# Patient Record
Sex: Female | Born: 1975 | Race: Black or African American | Hispanic: No | Marital: Single | Smoking: Never smoker
Health system: Southern US, Community
[De-identification: ages and names within clinical notes are randomized; demographics above are authoritative.]

## PROBLEM LIST (undated history)

## (undated) DIAGNOSIS — G40909 Epilepsy, unspecified, not intractable, without status epilepticus: Secondary | ICD-10-CM

## (undated) HISTORY — PX: SALPINGOOPHORECTOMY: SHX82

## (undated) HISTORY — PX: CARPAL TUNNEL RELEASE: SHX101

## (undated) HISTORY — DX: Epilepsy, unspecified, not intractable, without status epilepticus: G40.909

---

## 2000-10-30 ENCOUNTER — Encounter: Payer: Self-pay | Admitting: Emergency Medicine

## 2000-10-30 ENCOUNTER — Emergency Department (HOSPITAL_COMMUNITY): Admission: EM | Admit: 2000-10-30 | Discharge: 2000-10-30 | Payer: Self-pay | Admitting: Emergency Medicine

## 2001-01-19 ENCOUNTER — Other Ambulatory Visit: Admission: RE | Admit: 2001-01-19 | Discharge: 2001-01-19 | Payer: Self-pay | Admitting: *Deleted

## 2002-02-08 ENCOUNTER — Other Ambulatory Visit: Admission: RE | Admit: 2002-02-08 | Discharge: 2002-02-08 | Payer: Self-pay | Admitting: *Deleted

## 2002-06-21 ENCOUNTER — Emergency Department (HOSPITAL_COMMUNITY): Admission: EM | Admit: 2002-06-21 | Discharge: 2002-06-21 | Payer: Self-pay | Admitting: Emergency Medicine

## 2003-06-05 ENCOUNTER — Other Ambulatory Visit: Admission: RE | Admit: 2003-06-05 | Discharge: 2003-06-05 | Payer: Self-pay | Admitting: Obstetrics and Gynecology

## 2004-06-11 ENCOUNTER — Other Ambulatory Visit: Admission: RE | Admit: 2004-06-11 | Discharge: 2004-06-11 | Payer: Self-pay | Admitting: Obstetrics and Gynecology

## 2005-06-24 ENCOUNTER — Other Ambulatory Visit: Admission: RE | Admit: 2005-06-24 | Discharge: 2005-06-24 | Payer: Self-pay | Admitting: Obstetrics and Gynecology

## 2007-08-01 ENCOUNTER — Ambulatory Visit (HOSPITAL_COMMUNITY): Admission: RE | Admit: 2007-08-01 | Discharge: 2007-08-01 | Payer: Self-pay | Admitting: Obstetrics and Gynecology

## 2007-08-01 ENCOUNTER — Encounter (INDEPENDENT_AMBULATORY_CARE_PROVIDER_SITE_OTHER): Payer: Self-pay | Admitting: Obstetrics and Gynecology

## 2010-12-29 NOTE — H&P (Signed)
Sheila Lowe, Sheila Lowe                ACCOUNT NO.:  [id number]   MEDICAL RECORD NO.:  [id number]          PATIENT TYPE:  AMB   LOCATION:  SDC                           FACILITY:  [facility]   PHYSICIAN:  Huel Cote, M.D. DATE OF BIRTH:  19-Jun-1976   DATE OF ADMISSION:  08/01/2007  DATE OF DISCHARGE:                              HISTORY & PHYSICAL   Surgery to take place on August 01, 2007, at 10:30 a.m.   The patient is a 35 year old nulligravida female who comes in for  resection of a probable dermoid cyst noted on ultrasound and recurrent  pelvic pain which may or may not be related to the cyst.  The patient  had an ultrasound performed  which identified approximately 5 cm right  ovarian cyst which appears on ultrasonography to be most consistent with  a dermoid.  She has also been having some intermittent pelvic pain.  It  is unclear if this is from intermittent torsion or is of a different  etiology.  Regardless, it was discussed with the patient that, given the  size of the dermoid, she is at high risk of torsion and should certainly  have this removed.  She at times has had significant pain which hurts  substantially and then resolves after approximately 5-7 minutes.  Otherwise has had no significant symptoms.   PAST MEDICAL HISTORY:  Is insignificant.   PAST SURGICAL HISTORY:  None.   PAST OBSTETRIC HISTORY:  None.   PAST GYN HISTORY:  No abnormal PAP smears.   ALLERGIES:  PENICILLIN.   MEDICATIONS:  Include Mircette only.   As stated, on ultrasound she has a left ovary which is normal and a  right ovarian 5 cm hyperechoic cyst which is suggestive of a dermoid.   PHYSICAL EXAMINATION:  VITAL SIGNS:  Blood pressure 145/92, weight 267  pounds.  CARDIAC:  Regular rate and rhythm.  LUNGS:  Clear.  ABDOMEN:  Soft and nontender.  PELVIC:  Normal external genitalia.  The cervix has no lesions.  The  uterus is normal in size. The right ovary is difficult to feel secondary  to the patient's body habitus, but there is a suggestion of some  enlargement, although a palpable mass is not obvious.   It was discussed with the patient the risks and benefits of proceeding  with surgery including bleeding, infection, and possible need to convert  to laparotomy should surgery not be able to be performed through the  camera.  She understands the risks of damage to bowel and bladder and  the risk of bleeding and need to convert to abdominal incision in the  event of any of these complications.  She understands the need for  surgery and agrees to proceed.      Huel Cote, M.D.  Electronically Signed     KR/MEDQ  D:  07/31/2007  T:  07/31/2007  Job:  [id number]

## 2010-12-29 NOTE — Op Note (Signed)
Sheila Lowe, Sheila Lowe                ACCOUNT NO.:  [id number]   MEDICAL RECORD NO.:  [id number]          PATIENT TYPE:  AMB   LOCATION:  SDC                           [facility]  [facility]   PHYSICIAN:  Huel Cote, M.D. DATE OF BIRTH:  Jun 07, 1976   DATE OF PROCEDURE:  08/01/2007  DATE OF DISCHARGE:                               OPERATIVE REPORT   PREOPERATIVE DIAGNOSIS:  Right ovarian mass and pain.   POSTOPERATIVE DIAGNOSIS:  Probable right dermoid cyst.   PROCEDURE:  Laparoscopic right salpingo-oophorectomy and resection of  dermoid with ovary.   SURGEON:  Dr. Huel Cote   ASSISTANT:  Zenaida Niece, M.D.   FINDINGS:  The ovary was approximately 5-6 cm in size with a probable  dermoid tumor noted within it.  Final pathology is pending.  The  remaining pelvic anatomy and abdominal anatomy appeared normal.  Final  pathology was pending as stated.   ESTIMATED BLOOD LOSS:  Minimal.   URINE OUTPUT:  Approximately 300 mL clear urine.   IV FLUIDS:  2200 mL LR.   There were no obvious complications.   PROCEDURE:  The patient was taken to operating room where general  anesthesia was obtained without difficulty.  She was then prepped and  draped in normal sterile fashion in dorsal lithotomy position.  A  speculum was placed within the vagina and a Hulka uterine manipulator  introduced into the uterus and a Foley catheter placed within the  bladder.  Attention was then turned to the patient's abdomen where a  small infraumbilical incision was made after injection with 0.25%  Marcaine.  The Veress needle was then introduced into the abdomen.  Intraperitoneal placement was attempted to be confirmed by both  aspiration and injection with normal saline.  It did take several tries  to get the Veress needle into a position which appeared to be in the  abdomen.  However, once gas flow was applied.  There was normal pressure  noted.  This was then removed and the OptiVu 10/11  trocar placed within  the umbilicus.  This was then visualized with the trocar reaching the  preperitoneal space and with direct visualization entering the  peritoneal cavity.  Once peritoneal cavity was entered, the uterus and  ovaries and tubes were inspected.  The left ovary appeared normal with  no obvious area of dermoid or other mass upon it.  The right ovary as  stated was elongated approximately 5-6 cm in size and was freely mobile.  There was a very small serosal fibroid noted on the upper left fundus.  Attention was then turned to the right ovary which was elevated with  atraumatic grasper.  The harmonic scalpel was then utilized to remove  the ovary and tube.  This was taken through the infundibulopelvic and  the utero-ovarian ligament without difficulty and the ovary and tube  were freed from their pedicles.  There was no active bleeding noted.  Therefore the camera was changed and two additional 5 mm trocars had  been placed in the lower lateral quadrants under direct visualization.  5 mm camera  was introduced through this and the harmonic scalpel  removed.  The endobag was placed into the 10/11 trocar in the midline  and the atraumatic grasper utilized to place the ovary and tube in the  endobag under visualization with a 5 mm scope in the lateral port.  This  was cinched down and endobag taken up to the umbilical incision.  The  endobag was then brought up to the level of the incision and the fascial  incision was extended slightly with Mayo scissors with the ovary pressed  up against the abdominal wall in the bag.  A Kocher clamp was utilized  to grasp the dermoid and large amount of fatty tissue was noted to exude  out of the ovary into the bag and collapsed the ovary which was then  removable through the umbilicus.  This was handed off to pathology.  The  fascia in the umbilicus was then closed with 0 Vicryl with the 5 mm  scope, watching for no bowel underneath the  incision.  The two 5-mm  ports were then utilized to inspect the ovarian pedicles one additional  time.  There was no active bleeding noted and all appeared stable.  Therefore the 5 mm trocars were removed under direct visualization and  the pneumoperitoneum reduced.  The skin was closed at all port sites  with 3-0 Vicryl in a subcuticular stitch.  Sponge, lap and needle counts  were correct x2 and the Hulka tenaculum and the Foley catheter were  removed from the patient's vagina and bladder and she was taken to the  recovery room awake and in stable condition      Huel Cote, M.D.  Electronically Signed     KR/MEDQ  D:  08/01/2007  T:  08/01/2007  Job:  [id number]

## 2011-05-21 LAB — CBC
HCT: 39.3
Hemoglobin: 13.2
MCHC: 33.6
MCV: 76.4 — ABNORMAL LOW
Platelets: 235
RBC: 5.14 — ABNORMAL HIGH
RDW: 15.6 — ABNORMAL HIGH
WBC: 6.1

## 2011-05-21 LAB — PREGNANCY, URINE: Preg Test, Ur: NEGATIVE

## 2014-04-25 ENCOUNTER — Other Ambulatory Visit: Payer: Self-pay | Admitting: Obstetrics and Gynecology

## 2014-04-25 DIAGNOSIS — R928 Other abnormal and inconclusive findings on diagnostic imaging of breast: Secondary | ICD-10-CM

## 2014-05-03 ENCOUNTER — Ambulatory Visit
Admission: RE | Admit: 2014-05-03 | Discharge: 2014-05-03 | Disposition: A | Discharge status: Home / Self Care | Payer: 59 | Source: Ambulatory Visit | Attending: Obstetrics and Gynecology | Admitting: Obstetrics and Gynecology

## 2014-05-03 ENCOUNTER — Encounter (INDEPENDENT_AMBULATORY_CARE_PROVIDER_SITE_OTHER): Payer: Self-pay

## 2014-05-03 DIAGNOSIS — R928 Other abnormal and inconclusive findings on diagnostic imaging of breast: Secondary | ICD-10-CM

## 2015-07-28 ENCOUNTER — Other Ambulatory Visit: Payer: Self-pay

## 2015-07-28 DIAGNOSIS — Z1231 Encounter for screening mammogram for malignant neoplasm of breast: Secondary | ICD-10-CM

## 2015-09-02 ENCOUNTER — Ambulatory Visit: Payer: 59

## 2015-09-23 ENCOUNTER — Ambulatory Visit
Admission: RE | Admit: 2015-09-23 | Discharge: 2015-09-23 | Disposition: A | Discharge status: Home / Self Care | Payer: 59 | Source: Ambulatory Visit

## 2015-09-23 DIAGNOSIS — Z1231 Encounter for screening mammogram for malignant neoplasm of breast: Secondary | ICD-10-CM

## 2017-02-14 IMAGING — MG MM SCREENING BREAST TOMO BILATERAL
6 of 9 series · 6 of 25 positions shown · non-contrast
Comparison: Previous exam(s).

CLINICAL DATA: Screening.

EXAM:
DIGITAL SCREENING BILATERAL MAMMOGRAM WITH 3D TOMO WITH CAD

[R CC (1 of 2)]
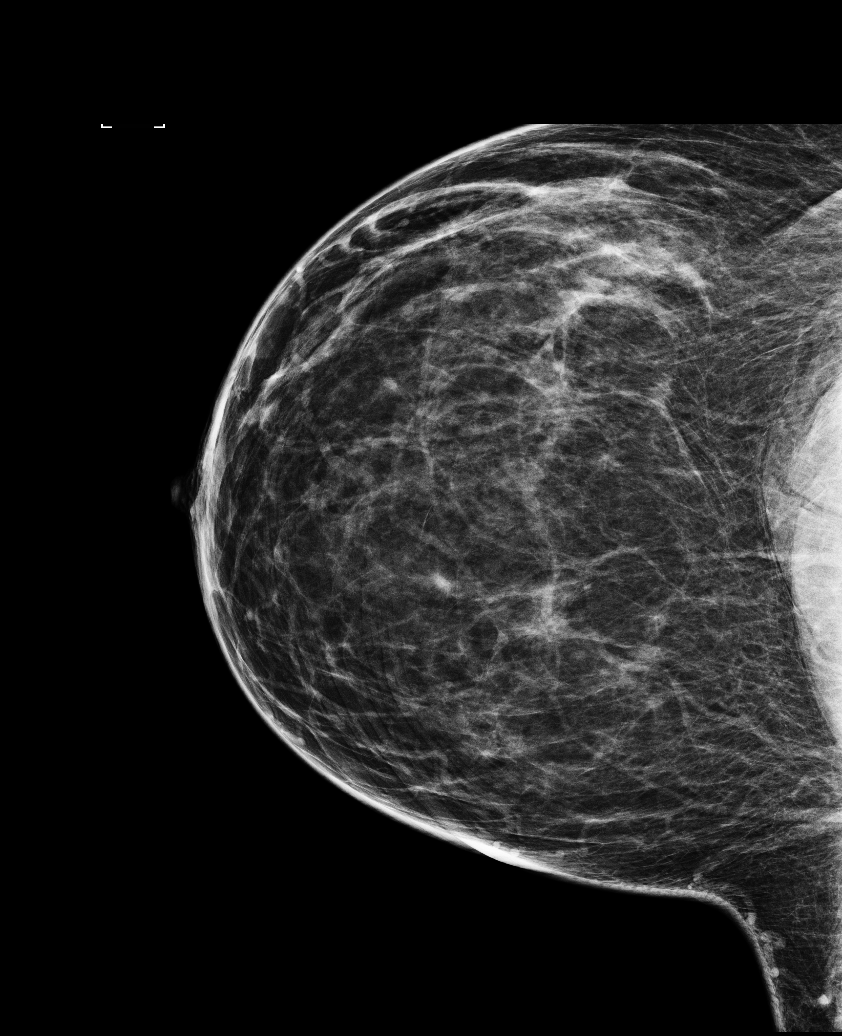

[R MLO]
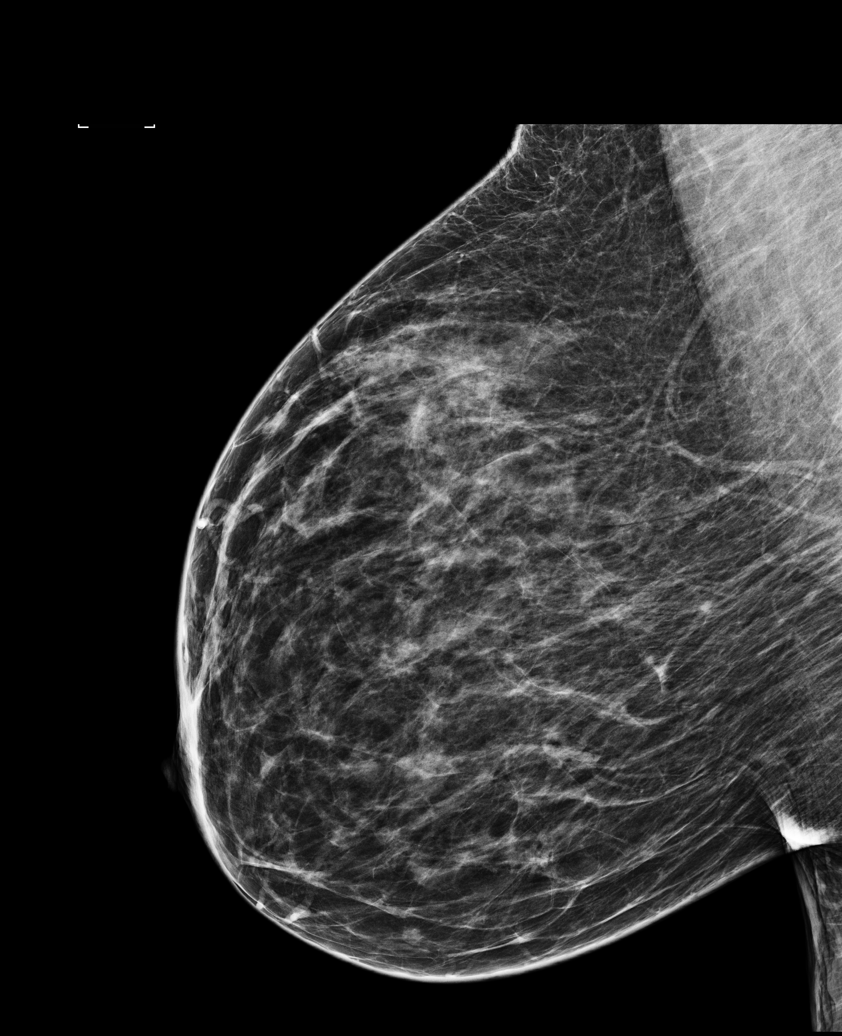

[R CC (2 of 2)]
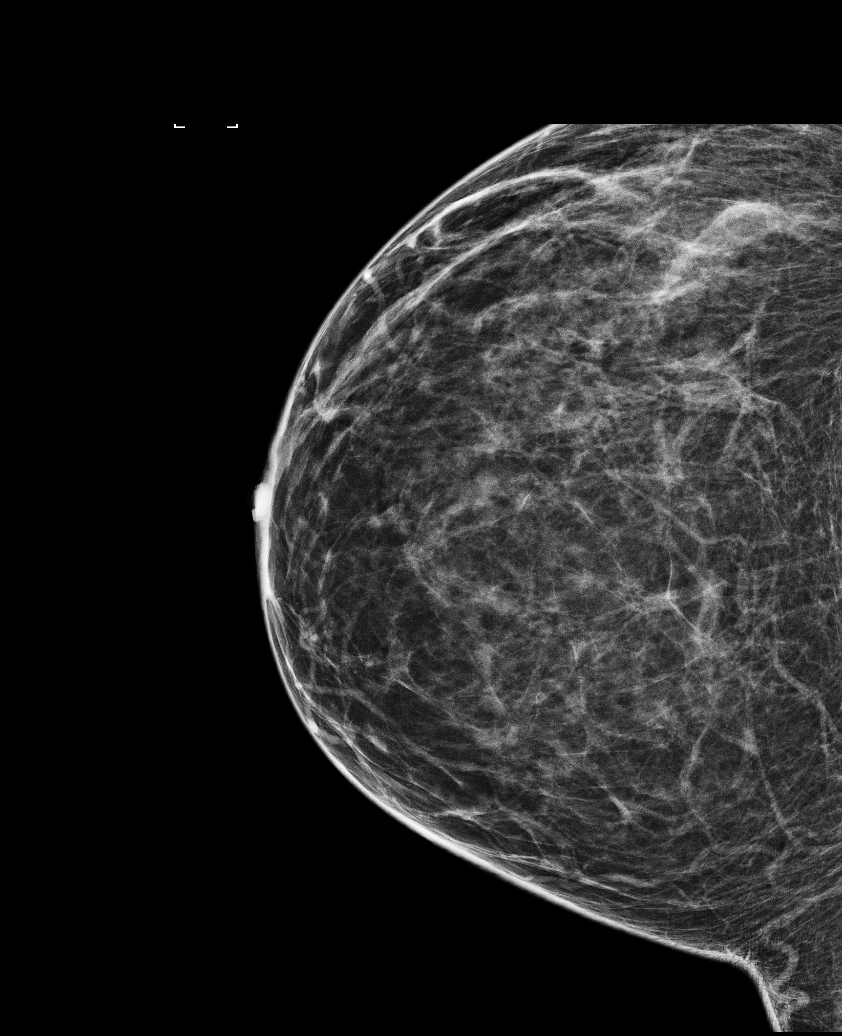

[L CC]
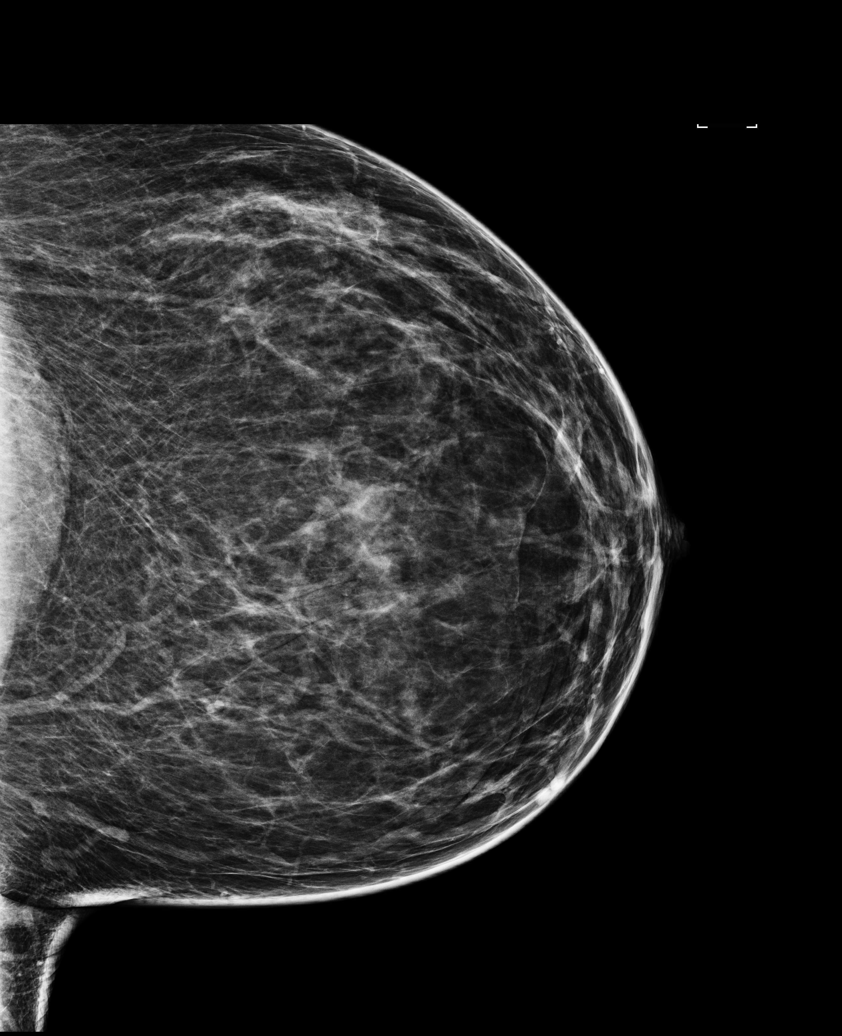

[L MLO]
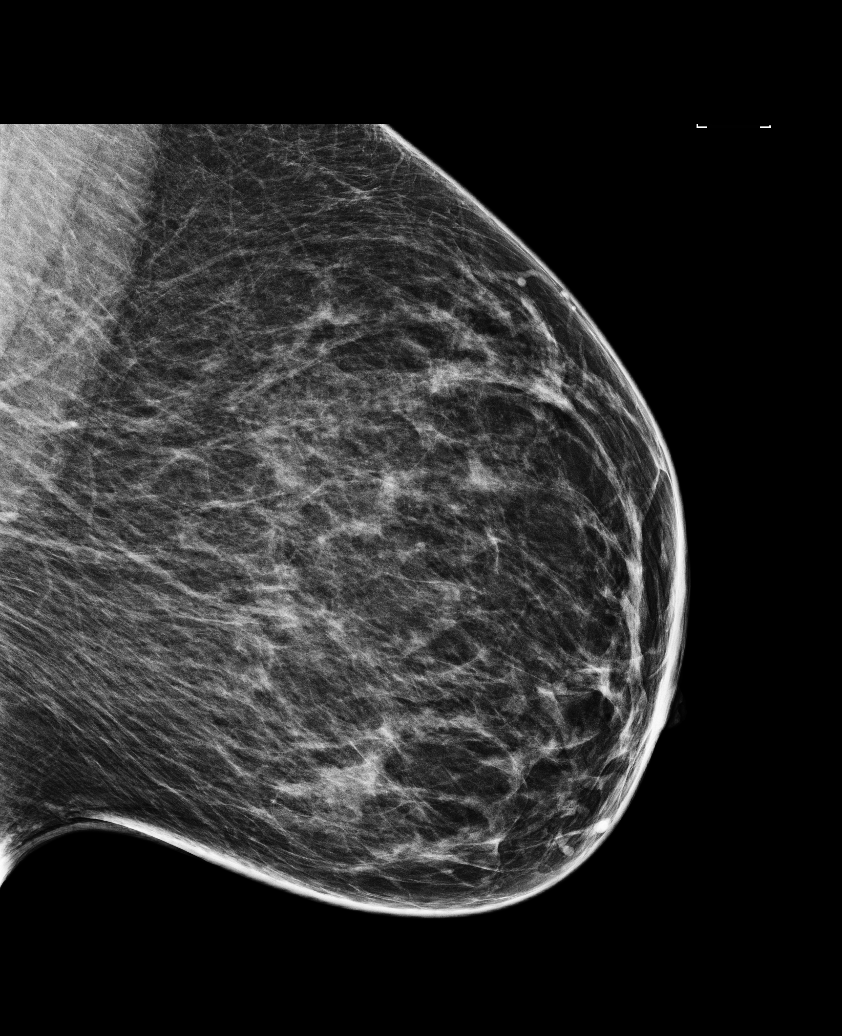

[R CC tomo · tomo slice 25/49.0]
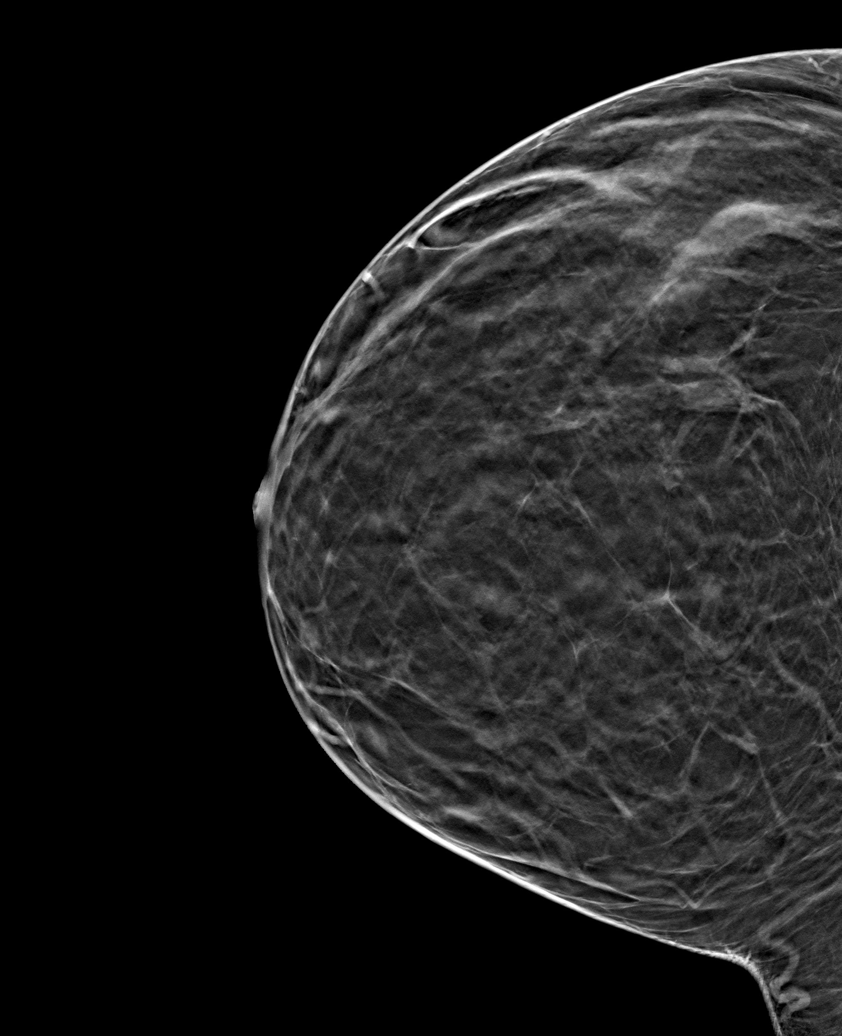

[6 of 25 positions shown; findings below may reference images not displayed]

ACR Breast Density Category b: There are scattered areas of
fibroglandular density.
FINDINGS: There are no findings suspicious for malignancy. Images were
processed with CAD.
IMPRESSION: No mammographic evidence of malignancy. A result letter of this
screening mammogram will be mailed directly to the patient.

RECOMMENDATION:
Screening mammogram in one year. (Code:55-L-23V)

BI-RADS CATEGORY  1: Negative.

## 2021-02-04 DIAGNOSIS — M79662 Pain in left lower leg: Secondary | ICD-10-CM | POA: Diagnosis not present

## 2021-02-04 DIAGNOSIS — I82412 Acute embolism and thrombosis of left femoral vein: Secondary | ICD-10-CM

## 2021-02-04 HISTORY — DX: Acute embolism and thrombosis of left femoral vein: I82.412

## 2021-02-05 DIAGNOSIS — I82402 Acute embolism and thrombosis of unspecified deep veins of left lower extremity: Secondary | ICD-10-CM | POA: Diagnosis not present

## 2021-02-05 DIAGNOSIS — M79662 Pain in left lower leg: Secondary | ICD-10-CM | POA: Diagnosis not present

## 2021-02-05 DIAGNOSIS — I82462 Acute embolism and thrombosis of left calf muscular vein: Secondary | ICD-10-CM | POA: Diagnosis not present

## 2021-02-26 DIAGNOSIS — M79662 Pain in left lower leg: Secondary | ICD-10-CM | POA: Diagnosis not present

## 2021-02-26 DIAGNOSIS — I82452 Acute embolism and thrombosis of left peroneal vein: Secondary | ICD-10-CM | POA: Diagnosis not present

## 2021-03-23 DIAGNOSIS — G40209 Localization-related (focal) (partial) symptomatic epilepsy and epileptic syndromes with complex partial seizures, not intractable, without status epilepticus: Secondary | ICD-10-CM | POA: Diagnosis not present

## 2021-05-26 DIAGNOSIS — Z8349 Family history of other endocrine, nutritional and metabolic diseases: Secondary | ICD-10-CM | POA: Diagnosis not present

## 2021-05-26 DIAGNOSIS — Z6841 Body Mass Index (BMI) 40.0 and over, adult: Secondary | ICD-10-CM | POA: Diagnosis not present

## 2021-05-26 DIAGNOSIS — I82409 Acute embolism and thrombosis of unspecified deep veins of unspecified lower extremity: Secondary | ICD-10-CM | POA: Diagnosis not present

## 2021-05-26 DIAGNOSIS — Z1231 Encounter for screening mammogram for malignant neoplasm of breast: Secondary | ICD-10-CM | POA: Diagnosis not present

## 2021-05-26 DIAGNOSIS — Z1389 Encounter for screening for other disorder: Secondary | ICD-10-CM | POA: Diagnosis not present

## 2021-05-26 DIAGNOSIS — Z01419 Encounter for gynecological examination (general) (routine) without abnormal findings: Secondary | ICD-10-CM | POA: Diagnosis not present

## 2021-05-28 ENCOUNTER — Telehealth: Payer: Self-pay | Admitting: Physician Assistant

## 2021-05-28 NOTE — Telephone Encounter (Signed)
Scheduled appt per 10/12 referral. Pt is aware of appt date and time.  

## 2021-05-29 DIAGNOSIS — D259 Leiomyoma of uterus, unspecified: Secondary | ICD-10-CM | POA: Diagnosis not present

## 2021-06-03 ENCOUNTER — Encounter: Payer: Self-pay | Admitting: Physician Assistant

## 2021-06-03 ENCOUNTER — Inpatient Hospital Stay: Payer: BC Managed Care – PPO

## 2021-06-03 ENCOUNTER — Inpatient Hospital Stay: Payer: BC Managed Care – PPO | Attending: Physician Assistant | Admitting: Physician Assistant

## 2021-06-03 ENCOUNTER — Other Ambulatory Visit: Payer: Self-pay

## 2021-06-03 ENCOUNTER — Telehealth: Payer: Self-pay

## 2021-06-03 VITALS — BP 143/81 | HR 72 | Temp 97.9°F | Resp 17 | Ht 66.5 in | Wt 249.3 lb

## 2021-06-03 DIAGNOSIS — I82452 Acute embolism and thrombosis of left peroneal vein: Secondary | ICD-10-CM

## 2021-06-03 DIAGNOSIS — M79605 Pain in left leg: Secondary | ICD-10-CM | POA: Diagnosis not present

## 2021-06-03 DIAGNOSIS — Z86718 Personal history of other venous thrombosis and embolism: Secondary | ICD-10-CM | POA: Insufficient documentation

## 2021-06-03 DIAGNOSIS — D509 Iron deficiency anemia, unspecified: Secondary | ICD-10-CM

## 2021-06-03 LAB — CBC WITH DIFFERENTIAL (CANCER CENTER ONLY)
Abs Immature Granulocytes: 0.01 10*3/uL (ref 0.00–0.07)
Basophils Absolute: 0 10*3/uL (ref 0.0–0.1)
Basophils Relative: 1 %
Eosinophils Absolute: 0.1 10*3/uL (ref 0.0–0.5)
Eosinophils Relative: 2 %
HCT: 34.8 % — ABNORMAL LOW (ref 36.0–46.0)
Hemoglobin: 10.9 g/dL — ABNORMAL LOW (ref 12.0–15.0)
Immature Granulocytes: 0 %
Lymphocytes Relative: 38 %
Lymphs Abs: 1.7 10*3/uL (ref 0.7–4.0)
MCH: 23.4 pg — ABNORMAL LOW (ref 26.0–34.0)
MCHC: 31.3 g/dL (ref 30.0–36.0)
MCV: 74.7 fL — ABNORMAL LOW (ref 80.0–100.0)
Monocytes Absolute: 0.5 10*3/uL (ref 0.1–1.0)
Monocytes Relative: 12 %
Neutro Abs: 2.1 10*3/uL (ref 1.7–7.7)
Neutrophils Relative %: 47 %
Platelet Count: 227 10*3/uL (ref 150–400)
RBC: 4.66 MIL/uL (ref 3.87–5.11)
RDW: 16.7 % — ABNORMAL HIGH (ref 11.5–15.5)
WBC Count: 4.5 10*3/uL (ref 4.0–10.5)
nRBC: 0 % (ref 0.0–0.2)

## 2021-06-03 LAB — CMP (CANCER CENTER ONLY)
ALT: 13 U/L (ref 0–44)
AST: 17 U/L (ref 15–41)
Albumin: 3.8 g/dL (ref 3.5–5.0)
Alkaline Phosphatase: 85 U/L (ref 38–126)
Anion gap: 8 (ref 5–15)
BUN: 15 mg/dL (ref 6–20)
CO2: 23 mmol/L (ref 22–32)
Calcium: 9.1 mg/dL (ref 8.9–10.3)
Chloride: 111 mmol/L (ref 98–111)
Creatinine: 0.83 mg/dL (ref 0.44–1.00)
GFR, Estimated: >60 mL/min (ref >60)
Glucose, Bld: 88 mg/dL (ref 70–99)
Potassium: 3.7 mmol/L (ref 3.5–5.1)
Sodium: 142 mmol/L (ref 135–145)
Total Bilirubin: <0.2 mg/dL — ABNORMAL LOW (ref 0.3–1.2)
Total Protein: 7.4 g/dL (ref 6.5–8.1)

## 2021-06-03 NOTE — Progress Notes (Signed)
[facility] [phone]   [phone]  INITIAL CONSULT NOTE  Patient Care Team: Pcp, No as PCP - General  Hematological/Oncological History 1) 02/04/2021: Presented to urgent care due to left calf pain x 2 weeks. Doppler US of left lower extremity confirmed acute DVT involving the left peroneal vein secondary to chronic OCP use. Initiated anticoagulation with Xarelto.   2) End of September 2022: Discontinued Xarelto  3) 06/03/2021: Establish care at [facility] Hematology/Oncology  CHIEF COMPLAINTS/PURPOSE OF CONSULTATION:  "Left lower leg DVT "  HISTORY OF PRESENTING ILLNESS:  Sheila Lowe 45 y.o. female with medical history significant for epilepsy presents to the clinic for evaluation for history of left lower leg DVT. She is unaccompanied for this visit.   On exam today, Ms. Weathersby reports that her energy and appetite levels are stable. She works full time as a Associate Professor and complete her daily activities on her own. She denies any nausea, vomiting or abdominal pain. Her bowel movements are unchanged without any diarrhea or constipation. She denies easy bruising or signs of bleeding. Patient reports swelling and pain in the left lower leg has improved but has not resolved. She denies any redness involving the left leg. Patient denies fevers, chills, night sweats, shortness of breath, chest pain or cough. She has no other complaints. Rest of the 10 ROS is below.   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   Epilepsy (HCC)     SURGICAL HISTORY: Past Surgical History:  Procedure Laterality Date   CARPAL TUNNEL RELEASE     SALPINGOOPHORECTOMY     Due to ovarian cyst    SOCIAL HISTORY: Social History   Socioeconomic History   Marital status: Single    Spouse name: Not on file   Number of children: Not on file   Years of education: Not on file   Highest education level: Not on  file  Occupational History   Not on file  Tobacco Use   Smoking status: Never   Smokeless tobacco: Never  Substance and Sexual Activity   Alcohol use: Yes    Comment: occasionally, 2-3 times per month   Drug use: Never   Sexual activity: Not on file  Other Topics Concern   Not on file  Social History Narrative   Not on file   Social Determinants of Health   Financial Resource Strain: Not on file  Food Insecurity: Not on file  Transportation Needs: Not on file  Physical Activity: Not on file  Stress: Not on file  Social Connections: Not on file  Intimate Partner Violence: Not on file    FAMILY HISTORY: Family History  Problem Relation Age of Onset   Breast cancer Mother    Stroke Maternal Grandmother     ALLERGIES:  has no allergies on file.  MEDICATIONS:  No current outpatient medications on file.   No current facility-administered medications for this visit.    REVIEW OF SYSTEMS:   Constitutional: ( - ) fevers, ( - )  chills , ( - ) night sweats Eyes: ( - ) blurriness of vision, ( - ) double vision, ( - ) watery eyes Ears, nose, mouth, throat, and face: ( - ) mucositis, ( - ) sore throat Respiratory: ( - ) cough, ( - ) dyspnea, ( - ) wheezes Cardiovascular: ( - ) palpitation, ( - ) chest discomfort, ( + ) lower extremity swelling Gastrointestinal:  ( - )  nausea, ( - ) heartburn, ( - ) change in bowel habits Skin: ( - ) abnormal skin rashes Lymphatics: ( - ) new lymphadenopathy, ( - ) easy bruising Neurological: ( - ) numbness, ( - ) tingling, ( - ) new weaknesses Behavioral/Psych: ( - ) mood change, ( - ) new changes  All other systems were reviewed with the patient and are negative.  PHYSICAL EXAMINATION: ECOG PERFORMANCE STATUS: 1 - Symptomatic but completely ambulatory  Vitals:   06/03/21 0908  BP: (!) 143/81  Pulse: 72  Resp: 17  Temp: 97.9 F (36.6 C)  SpO2: 100%   Filed Weights   06/03/21 0908  Weight: 249 lb 4.8 oz (113.1 kg)    GENERAL:  well appearing female in NAD  SKIN: skin color, texture, turgor are normal, no rashes or significant lesions EYES: conjunctiva are pink and non-injected, sclera clear OROPHARYNX: no exudate, no erythema; lips, buccal mucosa, and tongue normal  NECK: supple, non-tender LYMPH:  no palpable lymphadenopathy in the cervical, axillary or supraclavicular lymph nodes.  LUNGS: clear to auscultation and percussion with normal breathing effort HEART: regular rate & rhythm and no murmurs. Mild left leg extremity edema ABDOMEN: soft, non-tender, non-distended, normal bowel sounds Musculoskeletal: no cyanosis of digits and no clubbing  PSYCH: alert & oriented x 3, fluent speech NEURO: no focal motor/sensory deficits  LABORATORY DATA:  I have reviewed the data as listed CBC Latest Ref Rng & Units 06/03/2021 07/31/2007  WBC 4.0 - 10.5 K/uL 4.5 6.1  Hemoglobin 12.0 - 15.0 g/dL 10.9(L) 13.2  Hematocrit 36.0 - 46.0 % 34.8(L) 39.3  Platelets 150 - 400 K/uL 227 235    CMP Latest Ref Rng & Units 06/03/2021  Glucose 70 - 99 mg/dL 88  BUN 6 - 20 mg/dL 15  Creatinine 9.32 - 6.71 mg/dL 2.45  Sodium 809 - 983 mmol/L 142  Potassium 3.5 - 5.1 mmol/L 3.7  Chloride 98 - 111 mmol/L 111  CO2 22 - 32 mmol/L 23  Calcium 8.9 - 10.3 mg/dL 9.1  Total Protein 6.5 - 8.1 g/dL 7.4  Total Bilirubin 0.3 - 1.2 mg/dL <3.8(S)  Alkaline Phos 38 - 126 U/L 85  AST 15 - 41 U/L 17  ALT 0 - 44 U/L 13   ASSESSMENT & PLAN Suhaila Troiano Hollar is a 45 y.o. female who presents to the clinic for evaluation for DVT involving the left peroneal vein. I reviewed provoking factors that causes venous thromboembolisms including surgery, infectious process, oral contraceptives, smoking, prolonged immobility, and recent travel.  Patient was on chronic oral contraceptives which is the likely cause of her DVT.  Patient has discontinued her contraceptives since diagnosis.  Patient was on 3 months of anticoagulation with Xarelto and discontinued end of  September 2022.  Since patient continues to report intermittent episodes of left leg tenderness and the edema has not resolved, we will proceed with a repeat Doppler ultrasound of the left leg.  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  If there is evidence of a new DVT, we will resume anticoagulation with Xarelto.  Patient will proceed with laboratory evaluation today to check CBC and CMP.  #DVT involving left lower leg: --Felt to be provoked from chronic use of oral contraceptives.Patient is no longer on hormone therapy --Patient completed 3 month course of anticoagulation with Xarelto in September 2022.  --Ordered doppler US of left leg to rule out new DVT since patient continues to have some pain and swelling. --Labs today  to check CBC and CMP --RTC based on above workup.    Orders Placed This Encounter  Procedures   CBC with Differential (Cancer Center Only)    Standing Status:   Future    Number of Occurrences:   1    Standing Expiration Date:   06/03/2022   CMP (Cancer Center only)    Standing Status:   Future    Number of Occurrences:   1    Standing Expiration Date:   06/03/2022   Iron and TIBC    Standing Status:   Future    Standing Expiration Date:   06/03/2022   Ferritin    Standing Status:   Future    Standing Expiration Date:   06/03/2022   Retic Panel    Standing Status:   Future    Standing Expiration Date:   06/03/2022    All questions were answered. The patient knows to call the clinic with any problems, questions or concerns.  I have spent a total of 60 minutes minutes of face-to-face and non-face-to-face time, preparing to see the patient, obtaining and/or reviewing separately obtained history, performing a medically appropriate examination, counseling and educating the patient, ordering tests, documenting clinical information in the electronic health record, and care coordination.   Georga Kaufmann, PA-C Department of  Hematology/Oncology [facility] at [facility] Phone: [phone]  Patient was seen with Dr. Leonides Schanz.   I have read the above note and personally examined the patient. I agree with the assessment and plan as noted above.  Briefly Ms. Lagares is a 45 year old female with medical history significant for a provoked left lower extremity DVT.  The DVT was thought to be provoked by estrogen containing birth control pills.  She is completed a full 67-month course of anticoagulation and has discontinued the blood thinners.  She is no longer on estrogen-based contraceptives.  She does continue to have some residual pain in her lower extremity and has requested a repeat ultrasound Doppler which I do believe is reasonable given her residual symptoms.  If there is found to be fresh appearing clot would consider restarting anticoagulation and continuing for a full 6-month duration.  Otherwise her residual symptoms may represent post thrombotic syndrome and could be managed conservatively.   Ulysees Barns, MD Department of Hematology/Oncology [facility] at [facility] Phone: [phone] Pager: [phone] Email: [email or web address]

## 2021-06-04 ENCOUNTER — Encounter (HOSPITAL_COMMUNITY): Payer: BC Managed Care – PPO

## 2021-06-04 DIAGNOSIS — I82402 Acute embolism and thrombosis of unspecified deep veins of left lower extremity: Secondary | ICD-10-CM | POA: Insufficient documentation

## 2021-06-04 NOTE — Telephone Encounter (Signed)
Done

## 2021-06-05 ENCOUNTER — Inpatient Hospital Stay: Payer: BC Managed Care – PPO

## 2021-06-05 ENCOUNTER — Telehealth: Payer: Self-pay | Admitting: Physician Assistant

## 2021-06-05 ENCOUNTER — Other Ambulatory Visit: Payer: Self-pay

## 2021-06-05 ENCOUNTER — Ambulatory Visit (HOSPITAL_COMMUNITY)
Admission: RE | Admit: 2021-06-05 | Discharge: 2021-06-05 | Disposition: A | Discharge status: Home / Self Care | Payer: BC Managed Care – PPO | Source: Ambulatory Visit | Attending: Physician Assistant | Admitting: Physician Assistant

## 2021-06-05 DIAGNOSIS — Z86718 Personal history of other venous thrombosis and embolism: Secondary | ICD-10-CM | POA: Diagnosis not present

## 2021-06-05 DIAGNOSIS — D509 Iron deficiency anemia, unspecified: Secondary | ICD-10-CM

## 2021-06-05 LAB — RETIC PANEL
Immature Retic Fract: 33.7 % — ABNORMAL HIGH (ref 2.3–15.9)
RBC.: 4.52 MIL/uL (ref 3.87–5.11)
Retic Count, Absolute: 80.9 10*3/uL (ref 19.0–186.0)
Retic Ct Pct: 1.8 % (ref 0.4–3.1)
Reticulocyte Hemoglobin: 24 pg — ABNORMAL LOW (ref >27.9)

## 2021-06-05 LAB — IRON AND TIBC
Iron: 34 ug/dL (ref 28–170)
Saturation Ratios: 7 % — ABNORMAL LOW (ref 10.4–31.8)
TIBC: 478 ug/dL — ABNORMAL HIGH (ref 250–450)
UIBC: 444 ug/dL

## 2021-06-05 LAB — FERRITIN: Ferritin: 5 ng/mL — ABNORMAL LOW (ref 11–307)

## 2021-06-05 MED ORDER — FERROUS SULFATE 325 (65 FE) MG PO TBEC: 325.0000 mg | DELAYED_RELEASE_TABLET | Freq: Every day | ORAL | 3 refills | Status: AC

## 2021-06-05 NOTE — Telephone Encounter (Signed)
I called [name] to review the labs from today and doppler US results. Doppler US shows no evidence of acute DVT. Labs show iron deficiency anemia. Patient adds that her menstrual cycle has been heavy and irregular since discontinuing her OCP due to recent DVT. She has plans to follow up with her gynecologist next week.   I discussed starting oral iron supplementation with ferrous sulfate 325 mg once daily. Advised to take with a source of vitamin C. I will see patient back in 3 months with repeat labs.

## 2021-06-05 NOTE — Progress Notes (Signed)
Left lower extremity venous duplex completed. Refer to "CV Proc" under chart review to view preliminary results.  06/05/2021 11:28 AM Eula Fried., MHA, RVT, RDCS, RDMS

## 2021-06-11 DIAGNOSIS — Z30430 Encounter for insertion of intrauterine contraceptive device: Secondary | ICD-10-CM | POA: Diagnosis not present

## 2021-09-14 DIAGNOSIS — Z0000 Encounter for general adult medical examination without abnormal findings: Secondary | ICD-10-CM | POA: Diagnosis not present

## 2021-09-14 DIAGNOSIS — E559 Vitamin D deficiency, unspecified: Secondary | ICD-10-CM | POA: Diagnosis not present

## 2021-09-14 DIAGNOSIS — Z79899 Other long term (current) drug therapy: Secondary | ICD-10-CM | POA: Diagnosis not present

## 2021-09-14 DIAGNOSIS — Z3200 Encounter for pregnancy test, result unknown: Secondary | ICD-10-CM | POA: Diagnosis not present

## 2021-09-14 DIAGNOSIS — L819 Disorder of pigmentation, unspecified: Secondary | ICD-10-CM | POA: Diagnosis not present

## 2021-09-14 DIAGNOSIS — Z13220 Encounter for screening for lipoid disorders: Secondary | ICD-10-CM | POA: Diagnosis not present

## 2021-09-14 DIAGNOSIS — R5383 Other fatigue: Secondary | ICD-10-CM | POA: Diagnosis not present

## 2021-09-14 DIAGNOSIS — H401131 Primary open-angle glaucoma, bilateral, mild stage: Secondary | ICD-10-CM | POA: Diagnosis not present

## 2021-09-17 DIAGNOSIS — B360 Pityriasis versicolor: Secondary | ICD-10-CM | POA: Diagnosis not present

## 2021-09-28 DIAGNOSIS — U071 COVID-19: Secondary | ICD-10-CM | POA: Diagnosis not present

## 2021-09-28 DIAGNOSIS — Z20822 Contact with and (suspected) exposure to covid-19: Secondary | ICD-10-CM | POA: Diagnosis not present

## 2021-09-28 DIAGNOSIS — R059 Cough, unspecified: Secondary | ICD-10-CM | POA: Diagnosis not present

## 2021-09-28 DIAGNOSIS — R0981 Nasal congestion: Secondary | ICD-10-CM | POA: Diagnosis not present

## 2021-12-14 DIAGNOSIS — X58XXXA Exposure to other specified factors, initial encounter: Secondary | ICD-10-CM | POA: Diagnosis not present

## 2021-12-14 DIAGNOSIS — S8012XA Contusion of left lower leg, initial encounter: Secondary | ICD-10-CM | POA: Diagnosis not present

## 2022-01-15 ENCOUNTER — Ambulatory Visit: Payer: BC Managed Care – PPO | Admitting: Family Medicine

## 2022-01-15 ENCOUNTER — Encounter: Payer: Self-pay | Admitting: Family Medicine

## 2022-01-15 VITALS — BP 134/94 | HR 61 | Ht 66.0 in | Wt 234.4 lb

## 2022-01-15 DIAGNOSIS — E559 Vitamin D deficiency, unspecified: Secondary | ICD-10-CM

## 2022-01-15 DIAGNOSIS — Z7689 Persons encountering health services in other specified circumstances: Secondary | ICD-10-CM | POA: Diagnosis not present

## 2022-01-15 DIAGNOSIS — G40909 Epilepsy, unspecified, not intractable, without status epilepticus: Secondary | ICD-10-CM

## 2022-01-15 MED ORDER — VITAMIN D (ERGOCALCIFEROL) 1.25 MG (50000 UNIT) PO CAPS: 50000.0000 [IU] | ORAL_CAPSULE | ORAL | 0 refills | Status: AC

## 2022-01-15 NOTE — Patient Instructions (Signed)
It was great meeting you today!  You came in to establish care and I am glad to see you are doing well!  Given your low vitamin D from your blood work in January I am prescribing once weekly vitamin D supplement for 8 weeks.  You can come in for a blood work visit in 8 weeks and if vitamin D in the normal range you can just do a daily maintenance dose in a multivitamin.  I will see you back for you next physical exam in January but if you need to be seen earlier than that for any new issues we're happy to fit you in, just give [place] a call!  Visit Reminders: - Stop by the pharmacy to pick up your prescriptions  - Continue to work on your healthy eating habits and incorporating exercise into your daily life.   Feel free to call with any questions or concerns at any time, at [phone].   Take care,  Dr. Cora Collum [facility]

## 2022-01-15 NOTE — Progress Notes (Signed)
    SUBJECTIVE:   CHIEF COMPLAINT / HPI:   Ms. Bail is a 46 yo who presents to establish care.   Patient moved from [place] about a year ago but originally from the [place] area. Last PCP in [place] though she tried a PCP out here but did not like them. She works with a different patient of mine who recommended her.   PMH Had blood clot in June of 2022. Not precipitated. Was on oral birth control. Feels it may be hormone related. Was put on Xeralto but not longer on it .   Diagnosed with epilepsy. Takes zenosenide. Last seizure June of last year. Has seizures with aura  Neurologist in [facility] Dr. Ernest Haber.   Hx fibroids. Has IUD that was placed in October by OBGYN  LMP 5/16. Flow normal now after IUD   Glaucoma- diagnosed about a year ago. Takes Zalatan  Iron deficiency anemia- takes po iron daily  Tinea versicolor- On breast, stomach, groin area since November.  Sees a dermatologist has appointment scheduled august 10th   No hx of HTN or diabetes   Surgery Hx R fallopian tube and ovary removed in 2001   Fam Hx Mom and brother both with hyperthyroid   Health maintenance Pap smear and mammogram done this year by [facility] gyn  Mood well. No concern for anxiety, depression Feels well supported   Saw patients results from [facility] medical. Vit D 29.47 TSH normal Lipid panel and CBC normal     OBJECTIVE:   BP (!) 134/94   Pulse 61   Ht 5\' 6"  (1.676 m)   Wt 234 lb 6 oz (106.3 kg)   LMP 12/29/2021   SpO2 99%   BMI 37.83 kg/m    General: alert, pleasant, NAD CV: RRR no murmurs Resp: CTAB normal WOB GI: soft, non distended Extremities: warm, dry. No LE edema  ASSESSMENT/PLAN:   Establish Care Patient new to [facility], was referred by another patient. Moved back to area from [place] one year ago. No specific concerns today. Addressed PMH  Vitamin D deficiency Vit D 29.47 reviewed from recent blood work at [facility]. Prescribed 50,000 units weekly  for 8 weeks. Can recheck at that time and if in normal range can switch to daily MVI   Epilepsy [facility]) Follows with Dr. Ernest Haber in [facility]. Currently well controlled on Zenosenide daily. Last seizure one year ago. Continue current management     [place]

## 2022-01-17 DIAGNOSIS — E559 Vitamin D deficiency, unspecified: Secondary | ICD-10-CM | POA: Insufficient documentation

## 2022-01-17 DIAGNOSIS — H409 Unspecified glaucoma: Secondary | ICD-10-CM | POA: Insufficient documentation

## 2022-01-17 DIAGNOSIS — G40909 Epilepsy, unspecified, not intractable, without status epilepticus: Secondary | ICD-10-CM | POA: Insufficient documentation

## 2022-01-17 NOTE — Assessment & Plan Note (Addendum)
Follows with Dr. Everlena Cooper in [place]. Currently well controlled on Zenosenide daily. Last seizure one year ago. Continue current management

## 2022-01-17 NOTE — Assessment & Plan Note (Signed)
Vit D 29.47 reviewed from recent blood work at [facility]. Prescribed 50,000 units weekly for 8 weeks. Can recheck at that time and if in normal range can switch to daily MVI

## 2022-02-26 DIAGNOSIS — H401131 Primary open-angle glaucoma, bilateral, mild stage: Secondary | ICD-10-CM | POA: Diagnosis not present

## 2022-03-25 DIAGNOSIS — L209 Atopic dermatitis, unspecified: Secondary | ICD-10-CM | POA: Diagnosis not present

## 2022-03-25 DIAGNOSIS — L80 Vitiligo: Secondary | ICD-10-CM | POA: Diagnosis not present

## 2022-03-25 DIAGNOSIS — L700 Acne vulgaris: Secondary | ICD-10-CM | POA: Diagnosis not present

## 2022-04-13 DIAGNOSIS — G40209 Localization-related (focal) (partial) symptomatic epilepsy and epileptic syndromes with complex partial seizures, not intractable, without status epilepticus: Secondary | ICD-10-CM | POA: Diagnosis not present

## 2022-07-14 DIAGNOSIS — G4700 Insomnia, unspecified: Secondary | ICD-10-CM | POA: Diagnosis not present

## 2022-07-14 DIAGNOSIS — G40209 Localization-related (focal) (partial) symptomatic epilepsy and epileptic syndromes with complex partial seizures, not intractable, without status epilepticus: Secondary | ICD-10-CM | POA: Diagnosis not present

## 2022-09-22 DIAGNOSIS — M25572 Pain in left ankle and joints of left foot: Secondary | ICD-10-CM | POA: Diagnosis not present

## 2022-10-07 DIAGNOSIS — S93492A Sprain of other ligament of left ankle, initial encounter: Secondary | ICD-10-CM | POA: Diagnosis not present

## 2022-11-05 DIAGNOSIS — S93492A Sprain of other ligament of left ankle, initial encounter: Secondary | ICD-10-CM | POA: Diagnosis not present

## 2023-02-15 DIAGNOSIS — Z30431 Encounter for routine checking of intrauterine contraceptive device: Secondary | ICD-10-CM | POA: Diagnosis not present

## 2023-02-15 DIAGNOSIS — N924 Excessive bleeding in the premenopausal period: Secondary | ICD-10-CM | POA: Diagnosis not present

## 2023-02-15 DIAGNOSIS — D251 Intramural leiomyoma of uterus: Secondary | ICD-10-CM | POA: Diagnosis not present

## 2023-02-15 DIAGNOSIS — R5383 Other fatigue: Secondary | ICD-10-CM | POA: Diagnosis not present

## 2023-03-01 DIAGNOSIS — R21 Rash and other nonspecific skin eruption: Secondary | ICD-10-CM | POA: Diagnosis not present

## 2023-06-10 DIAGNOSIS — G40209 Localization-related (focal) (partial) symptomatic epilepsy and epileptic syndromes with complex partial seizures, not intractable, without status epilepticus: Secondary | ICD-10-CM | POA: Diagnosis not present

## 2023-06-10 DIAGNOSIS — Z13220 Encounter for screening for lipoid disorders: Secondary | ICD-10-CM | POA: Diagnosis not present

## 2023-06-10 DIAGNOSIS — Z0000 Encounter for general adult medical examination without abnormal findings: Secondary | ICD-10-CM | POA: Diagnosis not present

## 2023-06-10 DIAGNOSIS — Z1329 Encounter for screening for other suspected endocrine disorder: Secondary | ICD-10-CM | POA: Diagnosis not present

## 2023-06-10 DIAGNOSIS — G4700 Insomnia, unspecified: Secondary | ICD-10-CM | POA: Diagnosis not present

## 2023-06-10 DIAGNOSIS — Z1331 Encounter for screening for depression: Secondary | ICD-10-CM | POA: Diagnosis not present

## 2023-06-10 DIAGNOSIS — Z130 Encounter for screening for diseases of the blood and blood-forming organs and certain disorders involving the immune mechanism: Secondary | ICD-10-CM | POA: Diagnosis not present

## 2023-06-10 DIAGNOSIS — H401131 Primary open-angle glaucoma, bilateral, mild stage: Secondary | ICD-10-CM | POA: Diagnosis not present

## 2023-09-11 LAB — COLOGUARD: COLOGUARD: NEGATIVE

## 2024-01-23 ENCOUNTER — Ambulatory Visit: Admitting: Podiatry

## 2024-01-24 ENCOUNTER — Ambulatory Visit: Admitting: Podiatry
